# Patient Record
Sex: Female | Born: 1970 | Race: Black or African American | Hispanic: No | Marital: Married | Smoking: Never smoker
Health system: Southern US, Community
[De-identification: ages and names within clinical notes are randomized; demographics above are authoritative.]

---

## 2010-03-03 ENCOUNTER — Emergency Department: Payer: Self-pay | Admitting: Emergency Medicine

## 2014-07-18 ENCOUNTER — Inpatient Hospital Stay: Payer: Self-pay | Admitting: Internal Medicine

## 2014-07-18 LAB — CBC WITH DIFFERENTIAL/PLATELET
Basophil #: 0 10*3/uL (ref 0.0–0.1)
Basophil %: 0.3 %
Eosinophil #: 0 10*3/uL (ref 0.0–0.7)
Eosinophil %: 0 %
HCT: 37.3 % (ref 35.0–47.0)
HGB: 12.4 g/dL (ref 12.0–16.0)
LYMPHS ABS: 0.6 10*3/uL — AB (ref 1.0–3.6)
LYMPHS PCT: 4.2 %
MCH: 29.3 pg (ref 26.0–34.0)
MCHC: 33.3 g/dL (ref 32.0–36.0)
MCV: 88 fL (ref 80–100)
MONO ABS: 0.3 x10 3/mm (ref 0.2–0.9)
MONOS PCT: 2 %
Neutrophil #: 14.2 10*3/uL — ABNORMAL HIGH (ref 1.4–6.5)
Neutrophil %: 93.5 %
Platelet: 307 10*3/uL (ref 150–440)
RBC: 4.24 10*6/uL (ref 3.80–5.20)
RDW: 14.5 % (ref 11.5–14.5)
WBC: 15.1 10*3/uL — ABNORMAL HIGH (ref 3.6–11.0)

## 2014-07-18 LAB — URINALYSIS, COMPLETE
BLOOD: NEGATIVE
Bilirubin,UR: NEGATIVE
GLUCOSE, UR: NEGATIVE mg/dL (ref 0–75)
KETONE: NEGATIVE
Leukocyte Esterase: NEGATIVE
Nitrite: NEGATIVE
Ph: 5 (ref 4.5–8.0)
SPECIFIC GRAVITY: 1.015 (ref 1.003–1.030)
WBC UR: 4 /HPF (ref 0–5)

## 2014-07-18 LAB — TROPONIN I: Troponin-I: <0.02 ng/mL

## 2014-07-18 LAB — COMPREHENSIVE METABOLIC PANEL
ALBUMIN: 3.6 g/dL (ref 3.4–5.0)
AST: 23 U/L (ref 15–37)
Alkaline Phosphatase: 63 U/L
Anion Gap: 13 (ref 7–16)
BUN: 6 mg/dL — ABNORMAL LOW (ref 7–18)
Bilirubin,Total: 0.3 mg/dL (ref 0.2–1.0)
CALCIUM: 8.8 mg/dL (ref 8.5–10.1)
Chloride: 103 mmol/L (ref 98–107)
Co2: 20 mmol/L — ABNORMAL LOW (ref 21–32)
Creatinine: 1.01 mg/dL (ref 0.60–1.30)
EGFR (African American): >60
EGFR (Non-African Amer.): >60
Glucose: 141 mg/dL — ABNORMAL HIGH (ref 65–99)
Osmolality: 272 (ref 275–301)
POTASSIUM: 3.3 mmol/L — AB (ref 3.5–5.1)
SGPT (ALT): 25 U/L
SODIUM: 136 mmol/L (ref 136–145)
Total Protein: 7.6 g/dL (ref 6.4–8.2)

## 2014-07-18 LAB — PHOSPHORUS: PHOSPHORUS: 2.3 mg/dL — AB (ref 2.5–4.9)

## 2014-07-18 LAB — PROTIME-INR
INR: 1.1
PROTHROMBIN TIME: 14.2 s (ref 11.5–14.7)

## 2014-07-18 LAB — PREGNANCY, URINE: Pregnancy Test, Urine: NEGATIVE m[IU]/mL

## 2014-07-18 LAB — MAGNESIUM: Magnesium: 1.4 mg/dL — ABNORMAL LOW

## 2014-07-19 LAB — BASIC METABOLIC PANEL
ANION GAP: 6 — AB (ref 7–16)
BUN: 6 mg/dL — AB (ref 7–18)
CHLORIDE: 115 mmol/L — AB (ref 98–107)
CO2: 22 mmol/L (ref 21–32)
CREATININE: 0.92 mg/dL (ref 0.60–1.30)
Calcium, Total: 7.7 mg/dL — ABNORMAL LOW (ref 8.5–10.1)
EGFR (Non-African Amer.): >60
GLUCOSE: 100 mg/dL — AB (ref 65–99)
OSMOLALITY: 283 (ref 275–301)
Potassium: 4.3 mmol/L (ref 3.5–5.1)
Sodium: 143 mmol/L (ref 136–145)

## 2014-07-19 LAB — CBC WITH DIFFERENTIAL/PLATELET
BASOS ABS: 0 10*3/uL (ref 0.0–0.1)
Basophil %: 0.2 %
EOS ABS: 0 10*3/uL (ref 0.0–0.7)
Eosinophil %: 0 %
HCT: 33.6 % — ABNORMAL LOW (ref 35.0–47.0)
HGB: 10.8 g/dL — AB (ref 12.0–16.0)
LYMPHS PCT: 6.1 %
Lymphocyte #: 0.9 10*3/uL — ABNORMAL LOW (ref 1.0–3.6)
MCH: 29 pg (ref 26.0–34.0)
MCHC: 32.3 g/dL (ref 32.0–36.0)
MCV: 90 fL (ref 80–100)
MONOS PCT: 5.3 %
Monocyte #: 0.8 x10 3/mm (ref 0.2–0.9)
NEUTROS ABS: 12.8 10*3/uL — AB (ref 1.4–6.5)
Neutrophil %: 88.4 %
PLATELETS: 260 10*3/uL (ref 150–440)
RBC: 3.73 10*6/uL — ABNORMAL LOW (ref 3.80–5.20)
RDW: 14.5 % (ref 11.5–14.5)
WBC: 14.5 10*3/uL — AB (ref 3.6–11.0)

## 2014-07-20 LAB — CBC WITH DIFFERENTIAL/PLATELET
Basophil #: 0 10*3/uL (ref 0.0–0.1)
Basophil %: 0.3 %
Eosinophil #: 0 10*3/uL (ref 0.0–0.7)
Eosinophil %: 0 %
HCT: 33.4 % — AB (ref 35.0–47.0)
HGB: 10.4 g/dL — ABNORMAL LOW (ref 12.0–16.0)
LYMPHS ABS: 1.1 10*3/uL (ref 1.0–3.6)
Lymphocyte %: 9.8 %
MCH: 28 pg (ref 26.0–34.0)
MCHC: 31 g/dL — ABNORMAL LOW (ref 32.0–36.0)
MCV: 90 fL (ref 80–100)
Monocyte #: 1 x10 3/mm — ABNORMAL HIGH (ref 0.2–0.9)
Monocyte %: 8.8 %
NEUTROS ABS: 8.9 10*3/uL — AB (ref 1.4–6.5)
NEUTROS PCT: 81.1 %
Platelet: 227 10*3/uL (ref 150–440)
RBC: 3.69 10*6/uL — ABNORMAL LOW (ref 3.80–5.20)
RDW: 14.9 % — AB (ref 11.5–14.5)
WBC: 11 10*3/uL (ref 3.6–11.0)

## 2014-07-21 LAB — URINE CULTURE

## 2014-07-23 LAB — CULTURE, BLOOD (SINGLE)

## 2015-03-12 NOTE — Discharge Summary (Signed)
PATIENT NAME:  Karina RickerGRIFFIS, [name] MR#:  [id number] DATE OF BIRTH:  Mar 28, 1971  DATE OF ADMISSION:  07/18/2014 DATE OF DISCHARGE:  07/21/2014  For a detailed note, please see the history and physical done on admission by Dr. Angelica Ranavid Hower.   DIAGNOSES AT DISCHARGE: As follows:  1. Sepsis secondary to urinary tract infection.  2. Urinary tract infection. 3. Leukocytosis.   The patient is being discharged on a regular diet.   ACTIVITY: As tolerated. Follow[facility]up with the [facility] in the next 1 to 2 weeks.   DISCHARGE MEDICATIONS: Ceftin 250 mg b.i.d. x 5 days.   PERTINENT STUDIES DONE DURING THE HOSPITAL COURSE: Blood cultures and urine cultures noted to be negative. A chest x-ray done on admission showing no acute disease.   HOSPITAL COURSE: This is a 44 year old female who presented to the hospital with fever, tachycardia, leukocytosis. Suspected to have sepsis.   1. Sepsis. The most likely cause of the patient's sepsis was probably urinary tract infection. The patient apparently was started on oral ciprofloxacin for urinary tract infection as an outpatient but she continued to have fevers and rigors. She was brought to the hospital, started on IV ceftriaxone and given IV fluids and supportive care. Her blood cultures and urine cultures remained negative. Her sepsis is now resolved as she is afebrile for the past 24 hours and hemodynamically stable. At this point she is being discharged on oral Ceftin for the urinary tract infection. Her sepsis has therefore been treated and resolved. 2. Urinary tract infection. This was likely the cause of the patient's sepsis. The patient was treated with IV Rocephin while in the hospital. She is currently being discharged on oral Ceftin for next few days.   CODE STATUS: The patient is a full code.   TIME SPENT ON DISCHARGE: 35 minutes.    ____________________________ Rolly PancakeVivek J. Cherlynn KaiserSainani, MD vjs:JT D: 07/21/2014 15:08:41 ET T: 07/21/2014 23:05:06  ET JOB#: [id number]  cc: Rolly PancakeVivek J. Cherlynn KaiserSainani, MD, <Dictator> Unknown doctor   Houston SirenVIVEK J Dosha Broshears MD ELECTRONICALLY SIGNED 07/28/2014 15:58

## 2015-03-12 NOTE — H&P (Signed)
PATIENT NAME:  Karina Lane, Karina Lane MR#:  [id number] DATE OF BIRTH:  11/24/1970  DATE OF ADMISSION:  07/18/2014  REFERRING PHYSICIAN: Sheryl L. Mindi Junker, MD  PRIMARY CARE PHYSICIAN: None.   CHIEF COMPLAINT: Fevers.  HISTORY OF PRESENT ILLNESS: A 44 year old African American female without significant past medical history presenting with fever. She describes 1 week duration of symptoms including dysuria with increased urinary frequency. She actually went to an urgent care about 4 days ago, diagnosed with a UTI and started on ciprofloxacin, has taken this antibiotic for 4 days in total. Despite that, still has symptoms including fevers, chills and generalized weakness. Her dysuria has improved however, she still does have increased urinary frequency. Her temperature at home measuring 103 degrees Fahrenheit, thus presented to the hospital for further workup and evaluation. No further complaints.   REVIEW OF SYSTEMS: CONSTITUTIONAL: Positive for fevers, chills, fatigue, weakness as described above.  EYES: Denies blurred vision, double vision, eye pain.  EARS, NOSE, THROAT: Denies tinnitus, ear pain, hearing loss.  RESPIRATORY: Denies cough, wheeze, shortness of breath.  CARDIOVASCULAR: Denies chest pain, palpitations, edema.  GASTROINTESTINAL: Denies nausea, vomiting, diarrhea, abdominal pain.  GENITOURINARY: Positive for increased urinary frequency as described above. Denies any current dysuria.  ENDOCRINE: Denies nocturia or thyroid problems.  HEMATOLOGY AND LYMPHATIC: Denies easy bruising or bleeding.  SKIN: Denies rashes or lesions.  MUSCULOSKELETAL: Denies pain in neck, back, shoulder, knees, hips or arthritic symptoms.  NEUROLOGIC: Denies paralysis or paresthesias.  PSYCHIATRIC: Denies anxiety or depressive symptoms.  Otherwise, full review of systems performed by me is negative.   PAST MEDICAL HISTORY: Cesarean section x 3 as well as tubal ligation, otherwise no further medical history.    SOCIAL HISTORY: Denies any tobacco use. Positive for occasional alcohol use. Denies any drug use.   FAMILY HISTORY: Positive for coronary artery disease in multiple family members.   ALLERGIES: ERYTHROMYCIN AND PENICILLIN.   HOME MEDICATIONS: None.   PHYSICAL EXAMINATION:  VITAL SIGNS: Temperature 103.1 degrees Fahrenheit, heart rate 124, respirations 18, blood pressure 99/57, saturating 93% room air. Weight 60.8 kg, BMI 26.2.  GENERAL: Well-nourished, well-developed [place] American female, currently in no acute distress.  HEAD: Normocephalic, atraumatic.  EYES: Pupils equal, round, reactive to light. Extraocular muscles intact. No scleral icterus.  MOUTH: Moist mucosal membrane. Dentition intact. No abscess noted.  EARS, NOSE, THROAT: Clear without exudates. No external lesions.  NECK: Supple. No thyromegaly. No nodules. No JVD.  PULMONARY: Clear to auscultation bilaterally without wheezes, rubs or rhonchi. No use of accessory muscles. Good respiratory effort.  CHEST: Nontender to palpation.  CARDIOVASCULAR: S1, S2, tachycardic. No murmurs, rubs or gallops. No edema. Pedal pulses 2+ bilaterally.  GASTROINTESTINAL: Soft, nontender, nondistended. No masses. Positive bowel sounds. No hepatosplenomegaly.  MUSCULOSKELETAL: No swelling, clubbing or edema. Range of motion full in all extremities. Positive CVA tenderness on the right side.  NEUROLOGIC: Cranial nerves II-XII intact. No gross focal neurological deficits. Sensation intact. Reflexes intact. SKIN: No ulceration, lesions, rash, cyanosis. Skin warm and dry. Turgor intact.  PSYCHIATRIC: Mood and affect within normal limits. Alert and oriented x 3. Insight and judgment intact.   LABORATORY DATA: Sodium 136, potassium 3.3, chloride 103, bicarbonate 20, anion gap of 13, BUN 6, creatinine 1.01, glucose 141. LFTs within normal limits. Magnesium 1.4. WBC 15.1, hemoglobin 12.4, platelets of 307,000. Pregnancy test negative. Urinalysis: WBCs  4, RBCs 2, leukocyte esterase and nitrite both negative. ABG performed 7.4; 32; 100; 19.7 with lactic acid of 1.2.  ASSESSMENT AND PLAN:  A 44 year old African American presenting with fever, found to be septic, recently diagnosed with a urinary tract infection and started on Cipro, despite that still having symptoms, now having fevers. 1.  Sepsis secondary to urinary tract infection with likely pyelonephritis based on exam. Meeting septic criteria by temperature, heart rate and leukocytosis present on admission. Panculture including blood, urine and follow up culture data. Antibiotic coverage with ceftriaxone. Intravenous fluid hydration to keep mean arterial pressure greater than 65.  2.  Hypomagnesemia. Replace magnesium, goal of 2.0. 3.  Hypokalemia. Replace to a potassium goal of 4 to 5.  4.  Lactic acidosis. Provide IV fluid hydration to keep mean arterial pressure greater than 65. 5.  Venous thromboembolism prophylaxis with heparin subcutaneous.  CODE STATUS: The patient is a full code.  TIME SPENT: Forty-five minutes.   ____________________________ Cletis Athensavid K. Hower, MD dkh:TT D: 07/18/2014 22:31:21 ET T: 07/18/2014 22:44:53 ET JOB#: [id number]  cc: Cletis Athensavid K. Hower, MD, <Dictator> DAVID Synetta ShadowK HOWER MD ELECTRONICALLY SIGNED 07/20/2014 1:45

## 2016-12-20 ENCOUNTER — Other Ambulatory Visit: Payer: Self-pay | Admitting: Family Medicine

## 2016-12-20 DIAGNOSIS — Z1231 Encounter for screening mammogram for malignant neoplasm of breast: Secondary | ICD-10-CM

## 2017-01-30 ENCOUNTER — Ambulatory Visit
Admission: RE | Admit: 2017-01-30 | Discharge: 2017-01-30 | Disposition: A | Discharge status: Home / Self Care | Payer: BC Managed Care – PPO | Source: Ambulatory Visit | Attending: Family Medicine | Admitting: Family Medicine

## 2017-01-30 ENCOUNTER — Other Ambulatory Visit: Payer: Self-pay | Admitting: Family Medicine

## 2017-01-30 DIAGNOSIS — Z1231 Encounter for screening mammogram for malignant neoplasm of breast: Secondary | ICD-10-CM

## 2017-02-06 ENCOUNTER — Inpatient Hospital Stay
Admission: RE | Admit: 2017-02-06 | Discharge: 2017-02-06 | Disposition: A | Discharge status: Home / Self Care | Payer: Self-pay | Source: Ambulatory Visit | Attending: *Deleted | Admitting: *Deleted

## 2017-02-06 ENCOUNTER — Other Ambulatory Visit: Payer: Self-pay | Admitting: *Deleted

## 2017-02-06 DIAGNOSIS — Z1231 Encounter for screening mammogram for malignant neoplasm of breast: Secondary | ICD-10-CM

## 2018-05-19 ENCOUNTER — Other Ambulatory Visit: Payer: Self-pay | Admitting: Family Medicine

## 2018-05-19 DIAGNOSIS — Z1231 Encounter for screening mammogram for malignant neoplasm of breast: Secondary | ICD-10-CM

## 2018-06-24 ENCOUNTER — Ambulatory Visit
Admission: RE | Admit: 2018-06-24 | Discharge: 2018-06-24 | Disposition: A | Discharge status: Home / Self Care | Payer: BC Managed Care – PPO | Source: Ambulatory Visit | Attending: Family Medicine | Admitting: Family Medicine

## 2018-06-24 DIAGNOSIS — Z1231 Encounter for screening mammogram for malignant neoplasm of breast: Secondary | ICD-10-CM

## 2019-06-10 ENCOUNTER — Other Ambulatory Visit: Payer: Self-pay | Admitting: Family Medicine

## 2019-06-10 DIAGNOSIS — Z1231 Encounter for screening mammogram for malignant neoplasm of breast: Secondary | ICD-10-CM

## 2019-06-29 ENCOUNTER — Ambulatory Visit
Admission: RE | Admit: 2019-06-29 | Discharge: 2019-06-29 | Disposition: A | Discharge status: Home / Self Care | Payer: BC Managed Care – PPO | Source: Ambulatory Visit | Attending: Family Medicine | Admitting: Family Medicine

## 2019-06-29 ENCOUNTER — Other Ambulatory Visit: Payer: Self-pay

## 2019-06-29 ENCOUNTER — Ambulatory Visit: Payer: BC Managed Care – PPO

## 2019-06-29 ENCOUNTER — Encounter (INDEPENDENT_AMBULATORY_CARE_PROVIDER_SITE_OTHER): Payer: Self-pay

## 2019-06-29 DIAGNOSIS — Z1231 Encounter for screening mammogram for malignant neoplasm of breast: Secondary | ICD-10-CM | POA: Insufficient documentation

## 2019-08-17 ENCOUNTER — Other Ambulatory Visit: Payer: Self-pay

## 2019-08-17 DIAGNOSIS — Z20822 Contact with and (suspected) exposure to covid-19: Secondary | ICD-10-CM

## 2019-08-19 ENCOUNTER — Telehealth: Payer: Self-pay | Admitting: Family Medicine

## 2019-08-19 LAB — NOVEL CORONAVIRUS, NAA: SARS-CoV-2, NAA: NOT DETECTED

## 2019-08-19 NOTE — Telephone Encounter (Signed)
Patient informed of negative test results.

## 2019-09-07 ENCOUNTER — Other Ambulatory Visit: Payer: Self-pay

## 2019-09-07 DIAGNOSIS — Z20822 Contact with and (suspected) exposure to covid-19: Secondary | ICD-10-CM

## 2019-09-09 LAB — NOVEL CORONAVIRUS, NAA: SARS-CoV-2, NAA: NOT DETECTED

## 2020-09-13 ENCOUNTER — Other Ambulatory Visit: Payer: Self-pay | Admitting: Family Medicine

## 2020-09-30 ENCOUNTER — Other Ambulatory Visit: Payer: Self-pay | Admitting: Family Medicine

## 2020-09-30 DIAGNOSIS — Z1231 Encounter for screening mammogram for malignant neoplasm of breast: Secondary | ICD-10-CM

## 2020-10-11 ENCOUNTER — Ambulatory Visit
Admission: RE | Admit: 2020-10-11 | Discharge: 2020-10-11 | Disposition: A | Discharge status: Home / Self Care | Payer: BC Managed Care – PPO | Source: Ambulatory Visit | Attending: Family Medicine | Admitting: Family Medicine

## 2020-10-11 ENCOUNTER — Other Ambulatory Visit: Payer: Self-pay

## 2020-10-11 DIAGNOSIS — Z1231 Encounter for screening mammogram for malignant neoplasm of breast: Secondary | ICD-10-CM | POA: Insufficient documentation

## 2021-02-13 ENCOUNTER — Other Ambulatory Visit: Payer: Self-pay | Admitting: Family Medicine

## 2021-02-13 DIAGNOSIS — Z1231 Encounter for screening mammogram for malignant neoplasm of breast: Secondary | ICD-10-CM

## 2021-05-12 ENCOUNTER — Telehealth: Payer: Self-pay

## 2021-05-12 NOTE — Telephone Encounter (Signed)
[facility] referring for screening for cervical cancer. Paper records. Called and left voicemail for patient to call back to be scheduled.

## 2021-06-01 ENCOUNTER — Encounter: Payer: Self-pay | Admitting: Advanced Practice Midwife

## 2021-06-01 ENCOUNTER — Other Ambulatory Visit: Payer: Self-pay

## 2021-06-01 ENCOUNTER — Ambulatory Visit: Payer: BC Managed Care – PPO | Admitting: Advanced Practice Midwife

## 2021-06-01 ENCOUNTER — Other Ambulatory Visit (HOSPITAL_COMMUNITY)
Admission: RE | Admit: 2021-06-01 | Discharge: 2021-06-01 | Disposition: A | Discharge status: Home / Self Care | Payer: BC Managed Care – PPO | Source: Ambulatory Visit | Attending: Advanced Practice Midwife | Admitting: Advanced Practice Midwife

## 2021-06-01 VITALS — BP 112/68 | Ht 60.0 in | Wt 155.0 lb

## 2021-06-01 DIAGNOSIS — Z124 Encounter for screening for malignant neoplasm of cervix: Secondary | ICD-10-CM | POA: Diagnosis not present

## 2021-06-01 NOTE — Patient Instructions (Signed)
Pap Test Why am I having this test? A Pap test, also called a Pap smear, is a screening test to check for signs of: Cancer of the vagina, cervix, and uterus. The cervix is the lower part of the uterus that opens into the vagina. Infection. Changes that may be a sign that cancer is developing (precancerous changes). Women need this test on a regular basis. In general, you should have a Pap test every 3 years until you reach menopause or age 50. Women aged 30-60 may choose to have their Pap test done at the same time as an HPV (human papillomavirus) test every 5 years (instead of every 3 years). Your health care provider may recommend having Pap tests more or less oftendepending on your medical conditions and past Pap test results. What kind of sample is taken?  Your health care provider will collect a sample of cells from the surface of your cervix. This will be done using a small cotton swab, plastic spatula, or brush. This sample is often collected during a pelvic exam, when you are lying on your back on an exam table with feet in footrests (stirrups). In some cases, fluids (secretions) from the cervix or vagina may also be collected. How do I prepare for this test? Be aware of where you are in your menstrual cycle. If you are menstruating on the day of the test, you may be asked to reschedule. You may need to reschedule if you have a known vaginal infection on the day of the test. Follow instructions from your health care provider about: Changing or stopping your regular medicines. Some medicines can cause abnormal test results, such as digitalis and tetracycline. Avoiding douching or taking a bath the day before or the day of the test. Tell a health care provider about: Any allergies you have. All medicines you are taking, including vitamins, herbs, eye drops, creams, and over-the-counter medicines. Any blood disorders you have. Any surgeries you have had. Any medical conditions you  have. Whether you are pregnant or may be pregnant. How are the results reported? Your test results will be reported as either abnormal or normal. A false-positive result can occur. A false positive is incorrect because itmeans that a condition is present when it is not. A false-negative result can occur. A false negative is incorrect because itmeans that a condition is not present when it is. What do the results mean? A normal test result means that you do not have signs of cancer of the vagina,cervix, or uterus. An abnormal result may mean that you have: Cancer. A Pap test by itself is not enough to diagnose cancer. You will have more tests done in this case. Precancerous changes in your vagina, cervix, or uterus. Inflammation of the cervix. An STD (sexually transmitted disease). A fungal infection. A parasite infection. Talk with your health care provider about what your results mean. Questions to ask your health care provider Ask your health care provider, or the department that is doing the test: When will my results be ready? How will I get my results? What are my treatment options? What other tests do I need? What are my next steps? Summary In general, women should have a Pap test every 3 years until they reach menopause or age 50. Your health care provider will collect a sample of cells from the surface of your cervix. This will be done using a small cotton swab, plastic spatula, or brush. In some cases, fluids (secretions) from the cervix or   vagina may also be collected. This information is not intended to replace advice given to you by your health care provider. Make sure you discuss any questions you have with your healthcare provider. Document Revised: 10/18/2020 Document Reviewed: 07/08/2020 Elsevier Patient Education  2022 Elsevier Inc.  

## 2021-06-01 NOTE — Progress Notes (Signed)
   Patient ID: Karina Lane, female   DOB: 1971/09/23, 50 y.o.   MRN: [id number]  Reason for Consult: Gynecologic Exam (Referral for pap. RM 4)   Referred by Rayetta Humphrey, MD  Subjective:  HPI:  Karina Lane is a 50 y.o. female being seen for PAP smear. Her last PAP smear was 7 years ago at this office and was normal. She has no gyn concerns today. She had a recent well woman visit with her [facility] provider and was referred for cervical cancer screening. Mammogram was ordered by [facility] provider.   History reviewed. No pertinent past medical history. Family History  Problem Relation Age of Onset   Hypertension Mother    Breast cancer Maternal Aunt        2 mat aunts   Bone cancer Maternal Uncle    Heart failure Maternal Grandmother    Hypertension Maternal Grandmother    Heart failure Maternal Grandfather    Hypertension Maternal Grandfather    History reviewed. No pertinent surgical history.  Short Social History:  Social History   Tobacco Use   Smoking status: Never   Smokeless tobacco: Never  Substance Use Topics   Alcohol use: Yes    Comment: occasionally    Allergies  Allergen Reactions   Penicillin G Rash    No current outpatient medications on file.   No current facility-administered medications for this visit.   Review of Systems  Constitutional:  Negative for chills and fever.  HENT:  Negative for congestion, ear discharge, ear pain, hearing loss, sinus pain and sore throat.   Eyes:  Negative for blurred vision and double vision.  Respiratory:  Negative for cough, shortness of breath and wheezing.   Cardiovascular:  Negative for chest pain, palpitations and leg swelling.  Gastrointestinal:  Negative for abdominal pain, blood in stool, constipation, diarrhea, heartburn, melena, nausea and vomiting.  Genitourinary:  Negative for dysuria, flank pain, frequency, hematuria and urgency.  Musculoskeletal:  Positive for joint pain. Negative for back pain  and myalgias.  Skin:  Negative for itching and rash.  Neurological:  Negative for dizziness, tingling, tremors, sensory change, speech change, focal weakness, seizures, loss of consciousness, weakness and headaches.  Endo/Heme/Allergies:  Negative for environmental allergies. Does not bruise/bleed easily.  Psychiatric/Behavioral:  Negative for depression, hallucinations, memory loss, substance abuse and suicidal ideas. The patient is not nervous/anxious and does not have insomnia.        Objective:  Objective   Vitals:   06/01/21 1330  BP: 112/68  Weight: 155 lb (70.3 kg)  Height: 5' (1.524 m)   Body mass index is 30.27 kg/m. Constitutional: Well nourished, well developed female in no acute distress.  HEENT: normal Skin: Warm and dry.  Cardiovascular: Regular rate and rhythm.   Extremity:  no edema   Respiratory: Clear to auscultation bilateral. Normal respiratory effort Neuro: DTRs 2+, Cranial nerves grossly intact Psych: Alert and Oriented x3. No memory deficits. Normal mood and affect.  MS: normal gait, normal bilateral lower extremity ROM/strength/stability.  Pelvic exam:  is not limited by body habitus EGBUS: within normal limits Vagina: within normal limits and with normal mucosa  Cervix: normal appearance, PAP specimen collected   Assessment/Plan:     50 y.o. G3 P3003 female, cervical cancer screening visit  PAP smear Follow up as needed after lab results   Tresea Mall CNM [facility] 06/01/2021, 2:11 PM

## 2021-06-07 LAB — CYTOLOGY - PAP
Comment: NEGATIVE
Diagnosis: NEGATIVE
Diagnosis: REACTIVE
High risk HPV: NEGATIVE

## 2022-03-29 IMAGING — MG DIGITAL SCREENING BILAT W/ TOMO W/ CAD
8 series · 8 of 24 positions shown · non-contrast
Comparison: Previous exam(s).

CLINICAL DATA: Screening.

EXAM:
DIGITAL SCREENING BILATERAL MAMMOGRAM WITH TOMO AND CAD

[R MLO synth-2D]
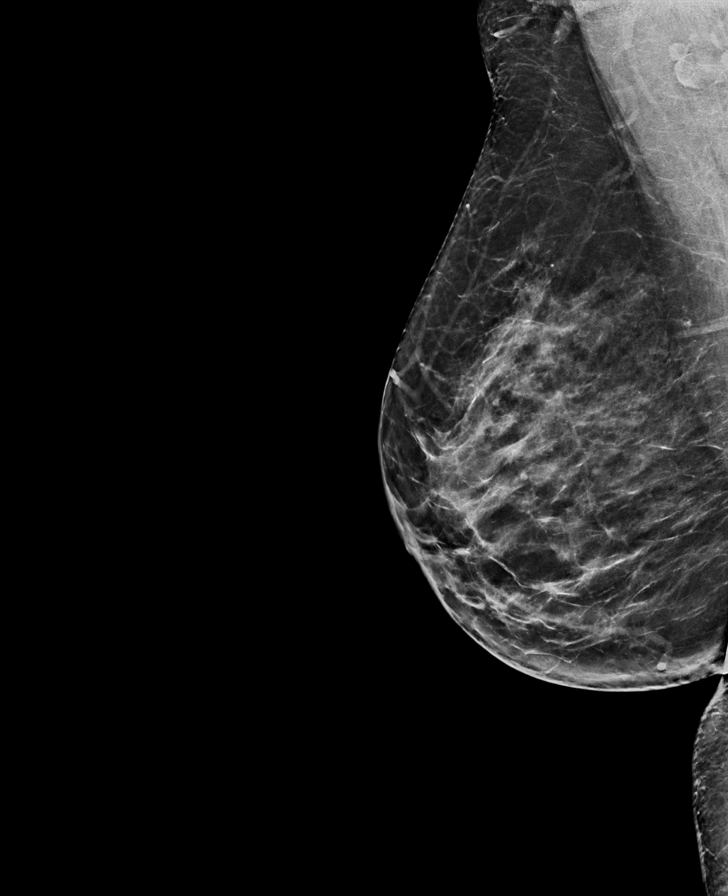

[R CC synth-2D]
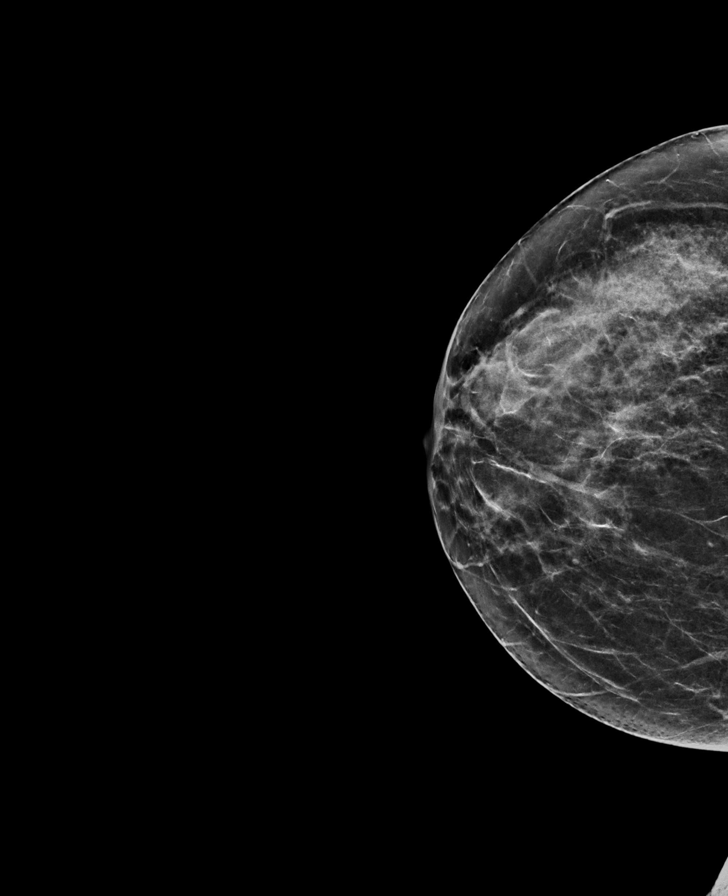

[L MLO synth-2D]
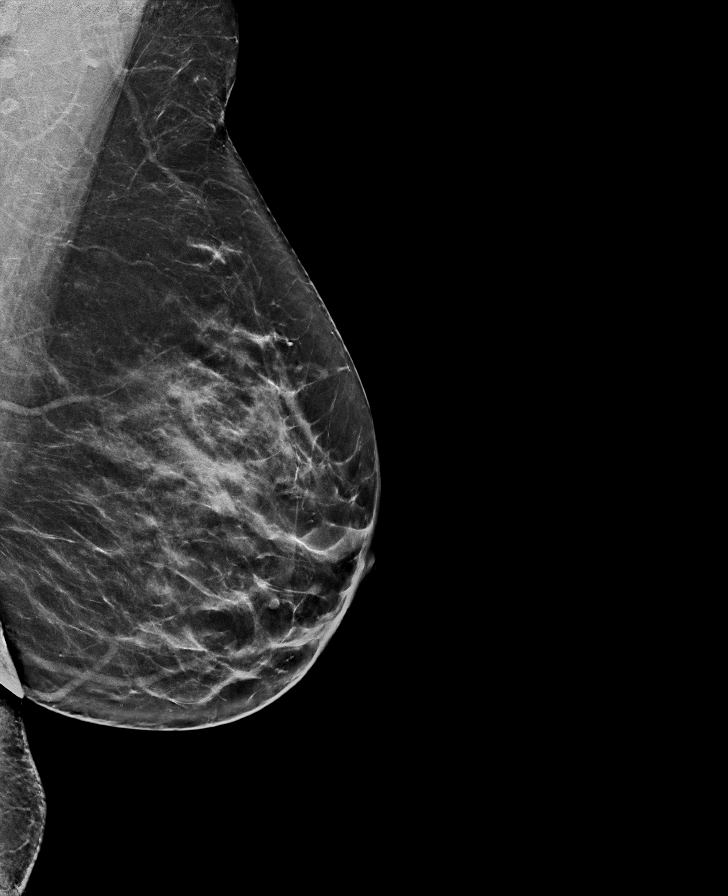

[L CC synth-2D]
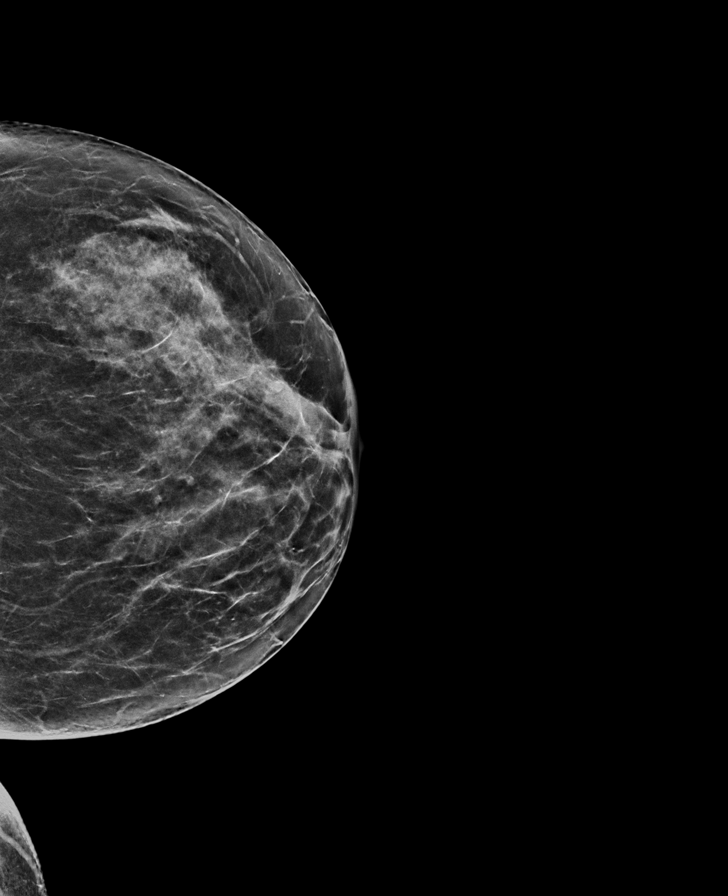

[L MLO tomo · tomo slice 36/71.0]
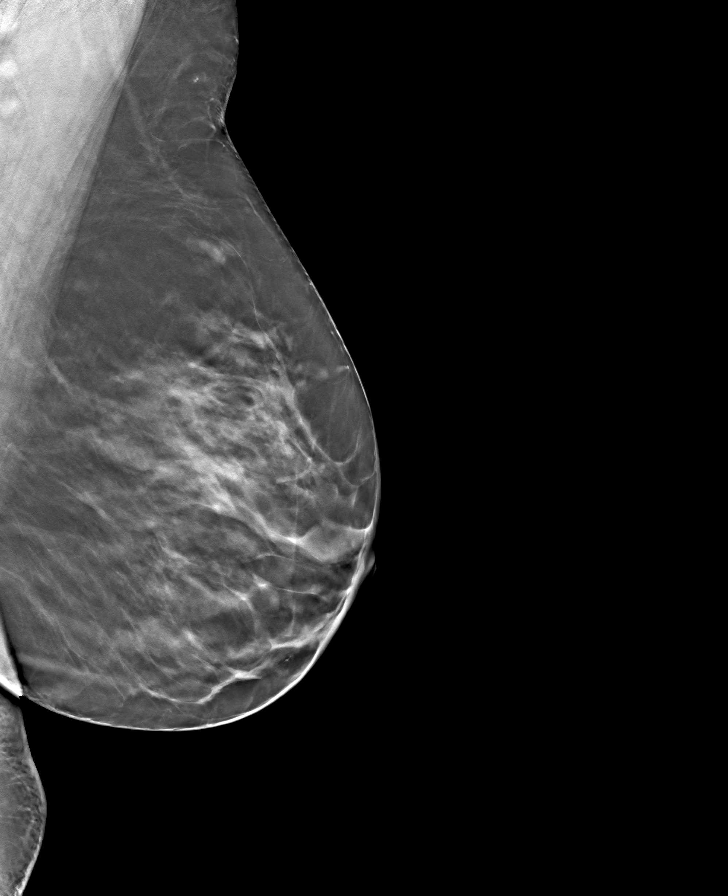

[R CC tomo · tomo slice 31/62.0]
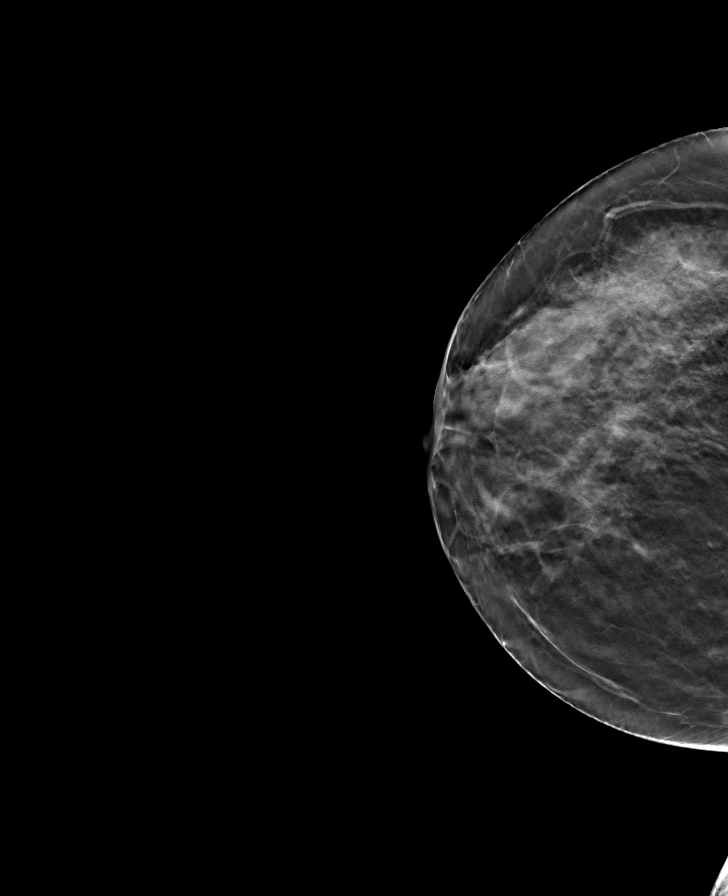

[L CC tomo · tomo slice 32/63.0]
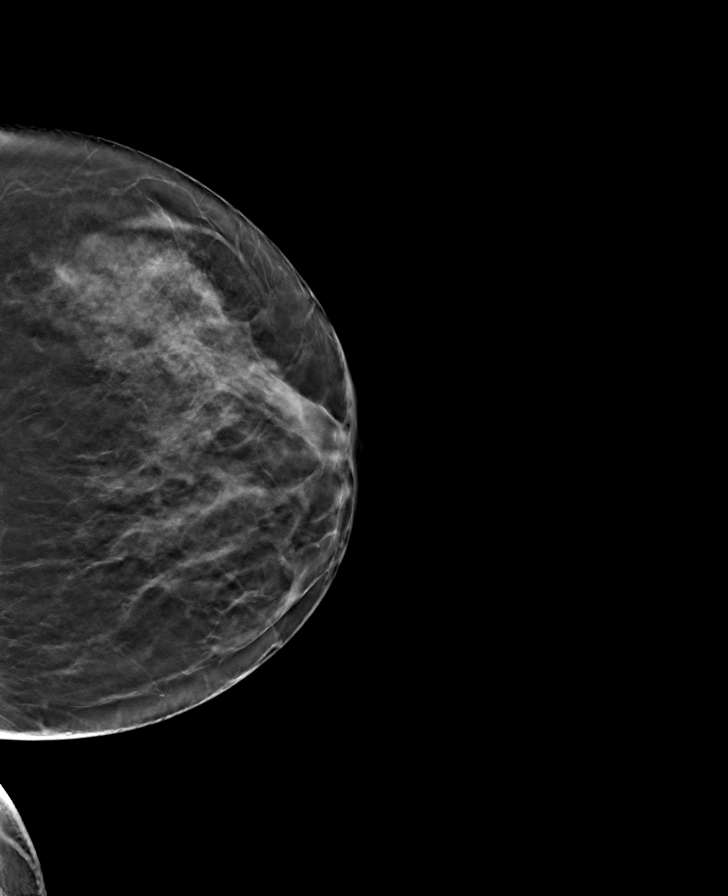

[R MLO tomo · tomo slice 36/71.0]
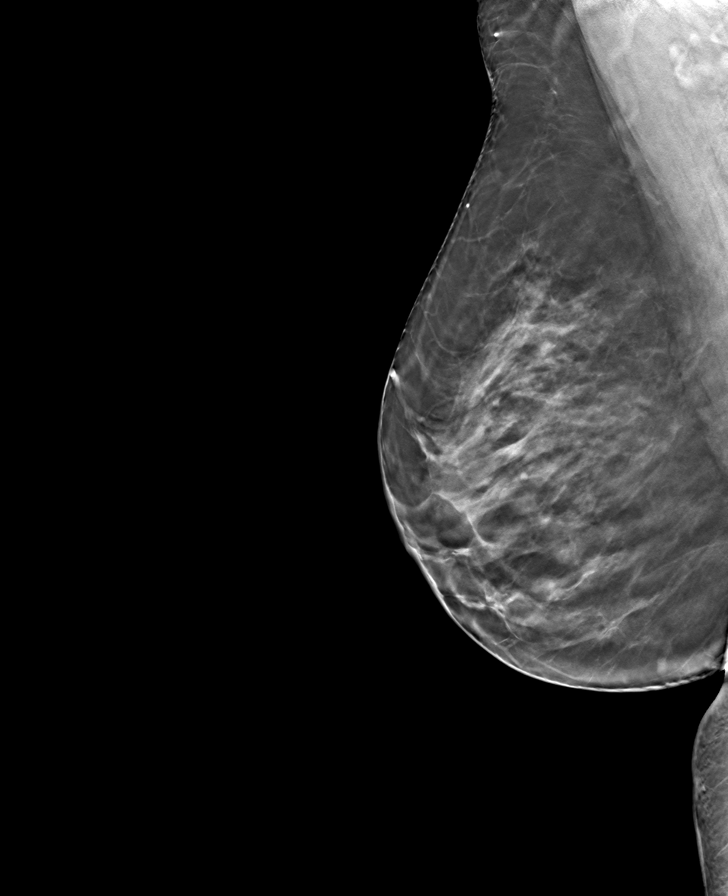

[8 of 24 positions shown; findings below may reference images not displayed]

ACR Breast Density Category c: The breast tissue is heterogeneously
dense, which may obscure small masses.
FINDINGS: There are no findings suspicious for malignancy. Images were
processed with CAD.
IMPRESSION: No mammographic evidence of malignancy. A result letter of this
screening mammogram will be mailed directly to the patient.

RECOMMENDATION:
Screening mammogram in one year. (Code:[id number]-[id number])

BI-RADS CATEGORY  1: Negative.

## 2022-05-03 ENCOUNTER — Other Ambulatory Visit: Payer: Self-pay | Admitting: Family Medicine

## 2022-05-03 DIAGNOSIS — Z1231 Encounter for screening mammogram for malignant neoplasm of breast: Secondary | ICD-10-CM

## 2022-06-06 ENCOUNTER — Ambulatory Visit
Admission: RE | Admit: 2022-06-06 | Discharge: 2022-06-06 | Disposition: A | Discharge status: Home / Self Care | Payer: BC Managed Care – PPO | Source: Ambulatory Visit | Attending: Family Medicine | Admitting: Family Medicine

## 2022-06-06 DIAGNOSIS — Z1231 Encounter for screening mammogram for malignant neoplasm of breast: Secondary | ICD-10-CM | POA: Diagnosis present

## 2022-10-24 ENCOUNTER — Emergency Department: Payer: BC Managed Care – PPO

## 2022-10-24 ENCOUNTER — Other Ambulatory Visit: Payer: Self-pay

## 2022-10-24 ENCOUNTER — Emergency Department
Admission: EM | Admit: 2022-10-24 | Discharge: 2022-10-24 | Disposition: A | Discharge status: Home / Self Care | Payer: BC Managed Care – PPO | Attending: Emergency Medicine | Admitting: Emergency Medicine

## 2022-10-24 DIAGNOSIS — K625 Hemorrhage of anus and rectum: Secondary | ICD-10-CM | POA: Diagnosis not present

## 2022-10-24 DIAGNOSIS — R109 Unspecified abdominal pain: Secondary | ICD-10-CM | POA: Diagnosis present

## 2022-10-24 DIAGNOSIS — K922 Gastrointestinal hemorrhage, unspecified: Secondary | ICD-10-CM

## 2022-10-24 LAB — URINALYSIS, ROUTINE W REFLEX MICROSCOPIC
Bilirubin Urine: NEGATIVE
Glucose, UA: NEGATIVE mg/dL
Hgb urine dipstick: NEGATIVE
Ketones, ur: NEGATIVE mg/dL
Nitrite: NEGATIVE
Protein, ur: NEGATIVE mg/dL
Specific Gravity, Urine: 1.009 (ref 1.005–1.030)
pH: 6 (ref 5.0–8.0)

## 2022-10-24 LAB — COMPREHENSIVE METABOLIC PANEL
ALT: 25 U/L (ref 0–44)
AST: 25 U/L (ref 15–41)
Albumin: 4.1 g/dL (ref 3.5–5.0)
Alkaline Phosphatase: 56 U/L (ref 38–126)
Anion gap: 7 (ref 5–15)
BUN: 8 mg/dL (ref 6–20)
CO2: 25 mmol/L (ref 22–32)
Calcium: 8.9 mg/dL (ref 8.9–10.3)
Chloride: 109 mmol/L (ref 98–111)
Creatinine, Ser: 0.75 mg/dL (ref 0.44–1.00)
GFR, Estimated: >60 mL/min (ref >60)
Glucose, Bld: 95 mg/dL (ref 70–99)
Potassium: 3.8 mmol/L (ref 3.5–5.1)
Sodium: 141 mmol/L (ref 135–145)
Total Bilirubin: 0.6 mg/dL (ref 0.3–1.2)
Total Protein: 7.8 g/dL (ref 6.5–8.1)

## 2022-10-24 LAB — CBC
HCT: 42.9 % (ref 36.0–46.0)
Hemoglobin: 13.9 g/dL (ref 12.0–15.0)
MCH: 30 pg (ref 26.0–34.0)
MCHC: 32.4 g/dL (ref 30.0–36.0)
MCV: 92.7 fL (ref 80.0–100.0)
Platelets: 286 10*3/uL (ref 150–400)
RBC: 4.63 MIL/uL (ref 3.87–5.11)
RDW: 13.4 % (ref 11.5–15.5)
WBC: 8.2 10*3/uL (ref 4.0–10.5)
nRBC: 0 % (ref 0.0–0.2)

## 2022-10-24 LAB — TYPE AND SCREEN
ABO/RH(D): O NEG
Antibody Screen: NEGATIVE

## 2022-10-24 LAB — POC URINE PREG, ED: Preg Test, Ur: NEGATIVE

## 2022-10-24 MED ORDER — IOHEXOL 350 MG/ML SOLN
100.0000 mL | Freq: Once | INTRAVENOUS | Status: AC | PRN
  Administered 2022-10-24: 100 mL via INTRAVENOUS

## 2022-10-24 NOTE — Discharge Instructions (Addendum)
I suspect that this is related to a hemorrhoid that is bleeding.  I have discussed the case with GI who will try to get you into their clinic earlier to have this evaluated.  If develop bleeding that is worsening or worsening fatigue or weakness return to the ER we can recheck your hemoglobin at that time or if you have any other concerns otherwise you will follow-up with GI outpatient.

## 2022-10-24 NOTE — ED Provider Notes (Signed)
[facility] Provider Note    Event Date/Time   First MD Initiated Contact with Patient 10/24/22 1148     (approximate)   History   Rectal Bleeding   HPI  EMERLY PRAK is a 51 y.o. female with history of internal hemorrhoids who comes in with concerns for rectal bleeding.  Patient reports that this started the week prior to Thanksgiving.  She reports that the bleeding has been intermittent with bright red blood.  Denies any black tarry stools.  She denies being on any blood thinners.  Patient was seen at her primary care doctor who sent her into the emergency room for CT scan hemoglobin check.  She does report some mild abdominal pain  Physical Exam   Triage Vital Signs: ED Triage Vitals  Enc Vitals Group     BP 10/24/22 1051 (!) 140/96     Pulse Rate 10/24/22 1051 85     Resp 10/24/22 1051 18     Temp 10/24/22 1051 98.3 F (36.8 C)     Temp src --      SpO2 10/24/22 1051 100 %     Weight 10/24/22 1052 156 lb (70.8 kg)     Height 10/24/22 1052 5' (1.524 m)     Head Circumference --      Peak Flow --      Pain Score 10/24/22 1052 8     Pain Loc --      Pain Edu? --      Excl. in GC? --     Most recent vital signs: Vitals:   10/24/22 1051  BP: (!) 140/96  Pulse: 85  Resp: 18  Temp: 98.3 F (36.8 C)  SpO2: 100%     General: Awake, no distress.  CV:  Good peripheral perfusion.  Resp:  Normal effort.  Abd:  No distention.  Soft and nontender Other:  Patient declined rectal exam given just had 1 done at her PCP   ED Results / Procedures / Treatments   Labs (all labs ordered are listed, but only abnormal results are displayed) Labs Reviewed  COMPREHENSIVE METABOLIC PANEL  CBC  POC OCCULT BLOOD, ED  POC URINE PREG, ED  TYPE AND SCREEN      RADIOLOGY I have reviewed the CT personally and interpreted  PROCEDURES:  Critical Care performed: No  Procedures   MEDICATIONS ORDERED IN ED: Medications - No data to  display   IMPRESSION / MDM / ASSESSMENT AND PLAN / ED COURSE  I reviewed the triage vital signs and the nursing notes.   Patient's presentation is most consistent with acute presentation with potential threat to life or bodily function.   Patient comes in with bright red blood per rectum suspect from internal hemorrhoid.  Patient reports needing a CT scan to peer discussed with patient that we typically do not order these for painless rectal bleeding but she is reporting some mild abdominal discomfort so we will proceed with CT imaging to rule out any evidence of diverticulosis diverticulitis or other acute pathology.  Patient CBC shows stable hemoglobin of 13.9 and this bleeding has been going on for multiple weeks.  Her CMP shows stable creatinine.  Pregnancy test was negative.  I did send a message to Dr. Norma Fredrickson who help get patient closer follow-up.  Patient handed off pending CT scan and if negative patient can be discharged home.  I considered admission given the bleeding been going on intermittently now for a few weeks  with stable hemoglobin patient feels comfortable with discharge home and have help facilitate closer follow-up with GI     FINAL CLINICAL IMPRESSION(S) / ED DIAGNOSES   Final diagnoses:  Lower GI bleed     Rx / DC Orders   ED Discharge Orders     None        Note:  This document was prepared using Dragon voice recognition software and may include unintentional dictation errors.   Concha Se, MD 10/24/22 1459

## 2022-10-24 NOTE — ED Notes (Signed)
Patient given saltines and ginger ale per  request. Patient is getting dressed. Patient requested removal of IV. Dr. Scotty Court aware.

## 2022-10-24 NOTE — ED Triage Notes (Addendum)
Pt states every time she goes to the bathroom she passes clots through the rectum, as big as a ping-pong-ball. Pt States the bleeding started as week ago. Pt states the blood is bright red, and the clots are dark red. Pt seen by PCP today who sent her here. Pt denies being on blood thinners

## 2022-10-24 NOTE — ED Notes (Signed)
Patient declined discharge vital signs. 

## 2022-10-24 NOTE — ED Provider Notes (Signed)
Procedures     ----------------------------------------- 4:29 PM on 10/24/2022 ----------------------------------------- CT interpreted by me, negative for acute bleeding or inflammatory changes in the colon.  Radiology report reviewed, no acute findings.  Stable for outpatient follow-up with GI.     Sharman Cheek, MD 10/24/22 1630

## 2024-02-27 ENCOUNTER — Ambulatory Visit
Admission: RE | Admit: 2024-02-27 | Discharge: 2024-02-27 | Disposition: A | Discharge status: Home / Self Care | Source: Ambulatory Visit | Attending: Family Medicine | Admitting: Family Medicine

## 2024-02-27 ENCOUNTER — Other Ambulatory Visit: Payer: Self-pay | Admitting: Family Medicine

## 2024-02-27 ENCOUNTER — Ambulatory Visit
Admission: RE | Admit: 2024-02-27 | Discharge: 2024-02-27 | Disposition: A | Discharge status: Home / Self Care | Attending: Family Medicine | Admitting: Family Medicine

## 2024-02-27 DIAGNOSIS — R058 Other specified cough: Secondary | ICD-10-CM
